# Patient Record
Sex: Male | Born: 1975 | Race: White | Hispanic: Yes | Marital: Married | Smoking: Former smoker
Health system: Western US, Academic
[De-identification: ages and names within clinical notes are randomized; demographics above are authoritative.]

## PROBLEM LIST (undated history)

## (undated) DIAGNOSIS — E7800 Pure hypercholesterolemia, unspecified: Secondary | ICD-10-CM

## (undated) DIAGNOSIS — I10 Essential (primary) hypertension: Secondary | ICD-10-CM

## (undated) HISTORY — DX: Pure hypercholesterolemia, unspecified: E78.00

## (undated) HISTORY — DX: Essential (primary) hypertension: I10

---

## 2019-01-01 ENCOUNTER — Encounter: Payer: Self-pay | Admitting: Internal Medicine

## 2019-01-01 ENCOUNTER — Telehealth: Payer: BLUE CROSS/BLUE SHIELD | Admitting: Internal Medicine

## 2019-01-01 ENCOUNTER — Telehealth: Payer: Self-pay

## 2019-01-01 DIAGNOSIS — Z20822 Contact with and (suspected) exposure to covid-19: Secondary | ICD-10-CM

## 2019-01-01 DIAGNOSIS — Z20828 Contact with and (suspected) exposure to other viral communicable diseases: Secondary | ICD-10-CM

## 2019-01-01 DIAGNOSIS — R197 Diarrhea, unspecified: Secondary | ICD-10-CM

## 2019-01-01 DIAGNOSIS — R519 Headache, unspecified: Secondary | ICD-10-CM

## 2019-01-01 MED ORDER — ATORVASTATIN CALCIUM 20 MG OR TABS: 20.0000 mg | ORAL_TABLET | Freq: Every day | ORAL | Status: AC

## 2019-01-01 MED ORDER — LISINOPRIL 10 MG OR TABS: 10.0000 mg | ORAL_TABLET | Freq: Every day | ORAL | Status: AC

## 2019-01-01 NOTE — Patient Instructions (Addendum)
To help limit the spread and risks of exposure to COVID-19, social distancing and general stay-at-home guidelines are recommended, along with practicing the following standard precautions:      - wash your hands regularly with soap and water for at least 20 seconds or use a hand sanitizer with at least 60% alcohol    - avoid touching your face (eyes, nose, mouth) with unwashed hands    - clean and disinfect frequently touched objects and surfaces with a regular household cleaning spray or wipe    - practice general social distancing by keeping distance of 6 feet away from others when possible    - avoid close contact with people who are sick    - sneeze/cough into your elbow or into a tissue that gets discarded     - stay home and away from others when you are sick    - avoid crowded places or mass gatherings    I know it has been tough as we are in the midst of truly unprecedented times, but we remain hopeful that all of our collective efforts will help get [place] through this.  Please do your best to keep regular sleep hours, get some fresh air and exercise regularly, keep normal meal times, and manage your stress as best as possible.  Things do continue to evolve and change, however, so please check regularly for updates.      Diarrhea, Adult  Diarrhea is frequent loose and watery bowel movements. Diarrhea can make you feel weak and cause you to become dehydrated. Dehydration can make you tired and thirsty, cause you to have a dry mouth, and decrease how often you urinate.  Diarrhea typically lasts 2-3 days. However, it can last longer if it is a sign of something more serious. It is important to treat your diarrhea as told by your health care provider.  Follow these instructions at home:  Eating and drinking         Follow these recommendations as told by your health care provider:   Take an oral rehydration solution (ORS). This is an over-the-counter medicine that helps return your body to its normal balance of  nutrients and water. It is found at pharmacies and retail stores.   Drink plenty of fluids, such as water, ice chips, diluted fruit juice, and low-calorie sports drinks. You can drink milk also, if desired.   Avoid drinking fluids that contain a lot of sugar or caffeine, such as energy drinks, sports drinks, and soda.   Eat bland, easy-to-digest foods in small amounts as you are able. These foods include bananas, applesauce, rice, lean meats, toast, and crackers.   Avoid alcohol.   Avoid spicy or fatty foods.    Medicines   Take over-the-counter and prescription medicines only as told by your health care provider.   If you were prescribed an antibiotic medicine, take it as told by your health care provider. Do not stop using the antibiotic even if you start to feel better.  General instructions     Wash your hands often using soap and water. If soap and water are not available, use a hand sanitizer. Others in the household should wash their hands as well. Hands should be washed:  ? After using the toilet or changing a diaper.  ? Before preparing, cooking, or serving food.  ? While caring for a sick person or while visiting someone in a hospital.   Drink enough fluid to keep your urine pale yellow.  Rest at home while you recover.   Watch your condition for any changes.   Take a warm bath to relieve any burning or pain from frequent diarrhea episodes.   Keep all follow-up visits as told by your health care provider. This is important.  Contact a health care provider if:   You have a fever.   Your diarrhea gets worse.   You have new symptoms.   You cannot keep fluids down.   You feel light-headed or dizzy.   You have a headache.   You have muscle cramps.  Get help right away if:   You have chest pain.   You feel extremely weak or you faint.   You have bloody or black stools or stools that look like tar.   You have severe pain, cramping, or bloating in your abdomen.   You have trouble  breathing or you are breathing very quickly.   Your heart is beating very quickly.   Your skin feels cold and clammy.   You feel confused.   You have signs of dehydration, such as:  ? Dark urine, very little urine, or no urine.  ? Cracked lips.  ? Dry mouth.  ? Sunken eyes.  ? Sleepiness.  ? Weakness.  Summary   Diarrhea is frequent loose and watery bowel movements. Diarrhea can make you feel weak and cause you to become dehydrated.   Drink enough fluids to keep your urine pale yellow.   Make sure that you wash your hands after using the toilet. If soap and water are not available, use hand sanitizer.   Contact a health care provider if your diarrhea gets worse or you have new symptoms.   Get help right away if you have signs of dehydration.  This information is not intended to replace advice given to you by your health care provider. Make sure you discuss any questions you have with your health care provider.  Document Released: 01/08/2002 Document Revised: 06/24/2017 Document Reviewed: 06/24/2017  Elsevier Interactive Patient Education  2019 [employer].

## 2019-01-01 NOTE — Telephone Encounter (Signed)
Patient's spouse had called stating that after video visit someone was suppose to reach out to them to schedule COVID-19 testing.  Renee stated that it was suppose to be scheduled  Today.  Please advise

## 2019-01-01 NOTE — Progress Notes (Signed)
Patient gives verbal consent to a Telephone or Video visit and is currently in the [place] of [place] during this visit.      Due to COVID-19 pandemic and a federally declared state of public health emergency, this service is being conducted via telephone.  Patient consented to proceeding with telemedicine visit today. Total time spent was 11-20 minutes. The telemedicine visit was conducted with Audio+Video     (Department) TELEMEDICINE APPOINTMENT    Date of service: 01/01/19     Reason for visit: Headache, Fatigue, Diarrhea, and Cough (tickling in throat)       Patient location:  Are you currently in the state of [place]: yes    HPI:    Was patient ever tested COVID-19 test: yes, rapid antigen on 11/28.    Patient says that over the last 6 days he has had a headache 7/10, occipital, woke him up last night, cough, eye itching, fatigue and diarrhea 7-8x/day, and is feeling colder than his usual. He says he had a temperature of 99.56F last night. He also mentions RUQ abdominal pain 4/10, dull, a/w bloating. He denies loss of taste or smell, fall or hitting his head, blurry vision, weakness, tingling, numbness, fecal or urinary incontinence, sob, sputum, wheezing, chest pain, blood in the stool, melena, nausea, vomiting, dysuria, hematuria, nasal congestion, eye discharge, sore throat, dizziness,     For his headache he has been taking     He denies anybody around him having similar symptoms.  He says he was exposed to someone that tested + for COVID-19 1 week ago. He works at a [employer].    History:    Past Medical History:   Diagnosis Date   . Hypercholesterolemia    . Hypertension      No past surgical history on file.  Family History   Problem Relation Name Age of Onset   . Diabetes Mother       Social History     Socioeconomic History   . Marital status: Married     Spouse name: Not on file   . Number of children: Not on file   . Years of education: Not on file   . Highest education level: Not on  file   Occupational History   . Not on file   Tobacco Use   . Smoking status: Former Smoker     Quit date: 01/01/1999     Years since quitting: 20.0   . Smokeless tobacco: Never Used   Substance and Sexual Activity   . Alcohol use: Yes     Frequency: 2-3 times a week     Drinks per session: 1 or 2   . Drug use: Never   . Sexual activity: Not on file   Social Activities of Daily Living Present   . Not on file   Social History Narrative   . Not on file     No Known Allergies  Current Outpatient Medications   Medication Sig Dispense Refill   . atorvastatin (LIPITOR) 20 MG tablet Take 20 mg by mouth daily.     Marland Kitchen lisinopril (PRINIVIL, ZESTRIL) 10 MG tablet Take 10 mg by mouth daily.       No current facility-administered medications for this visit.        Review of Systems:  A 12 point review of systems was negative except as noted per HPI.     Physical Examination:  Gen: Alert, no distress non-toxic appearing  Head: NC/AT  ENT:  unremarkable appearing external nose and ears  Pulm: No respiratory distress, no audible stridor, speaking in full sentences without difficulty  Skin: No jaundice, no cyanosis  Neuro: Awake, alert  Psych: conversing appropriately, normal affect and thought content is appropriate    Assessment and Plan:  Jatavis was seen today for headache, fatigue, diarrhea and cough.    Diagnoses and all orders for this visit:    Exposure to COVID-19 virus  Acute nonintractable headache, unspecified headache type  Diarrhea of presumed infectious origin  Symptoms are likely a viral syndrome, covid-19 test indicated given exposure  -     Coronavirus Disease 2019 (COVID-19) SCOV Nasal-Pharyngeal; Future  Patients symptoms are stable, no signs/symptoms that would require admission.  Patient counseled to continue to monitor for symptoms improvement/worsening and to go to the ED if worsening sob, nausea/vomiting, if unable to tolerate food or severe symptoms.  Tylenol prn for fevers and to maintain  hydration.  Advised patient to continue self-isolation for total 10 days since onset of symptoms, no fevers for 24 hours, and feeling well enough to work.   Advised to continue home isolation, physical distancing, wearing PPE, frequent handwashing.  To follow up promptly if any new or worsening symptoms.      Follow up PRN if symptoms worsen or fail to improve       Patient Questionnaire Responses on 01/01/2019   Are you in state of [place]?: Yes  Do you consent to proceed with the Video Visit today?: Yes

## 2019-01-01 NOTE — Interdisciplinary (Signed)
Patient being seen for headache, fatigue, diarrhea x 6 days    He works at [employer]- multiple coworkers have tested postive

## 2019-01-02 ENCOUNTER — Ambulatory Visit: Payer: BLUE CROSS/BLUE SHIELD | Attending: Internal Medicine

## 2019-01-02 DIAGNOSIS — R197 Diarrhea, unspecified: Secondary | ICD-10-CM | POA: Insufficient documentation

## 2019-01-02 DIAGNOSIS — R519 Headache, unspecified: Secondary | ICD-10-CM | POA: Insufficient documentation

## 2019-01-02 DIAGNOSIS — Z20828 Contact with and (suspected) exposure to other viral communicable diseases: Secondary | ICD-10-CM | POA: Insufficient documentation

## 2019-01-02 NOTE — Telephone Encounter (Signed)
Patient is schedule for COVID-19 Testing on 01/02/19 @ [facility] location

## 2019-01-02 NOTE — Interdisciplinary (Signed)
Pt symptomatic.  Verified name and DOB, explained procedure.  Performed nasal-pharyngeal swab for covid-19, pt tolerated well.  No s/s of distress noted.   TBrownRN

## 2019-01-03 ENCOUNTER — Encounter: Payer: Self-pay | Admitting: Internal Medicine

## 2019-01-03 LAB — CORONAVIRUS DISEASE 2019 (COVID-19) SCOV
COVID-19 Comment: NOT DETECTED
COVID-19 Result: NOT DETECTED

## 2019-01-03 NOTE — Telephone Encounter (Signed)
I informed patient of negative covid-19 over the phone, I also sent him a message in my chart.

## 2019-01-05 ENCOUNTER — Ambulatory Visit: Payer: BLUE CROSS/BLUE SHIELD | Admitting: Internal Medicine

## 2019-01-31 ENCOUNTER — Ambulatory Visit: Payer: BLUE CROSS/BLUE SHIELD | Admitting: Family Practice

## 2019-01-31 ENCOUNTER — Encounter: Payer: Self-pay | Admitting: Family Practice

## 2019-01-31 VITALS — BP 132/88 | HR 70 | Temp 98.9°F | Ht 66.0 in | Wt 235.0 lb

## 2019-01-31 DIAGNOSIS — Z1329 Encounter for screening for other suspected endocrine disorder: Secondary | ICD-10-CM

## 2019-01-31 DIAGNOSIS — E669 Obesity, unspecified: Secondary | ICD-10-CM

## 2019-01-31 DIAGNOSIS — Z6837 Body mass index (BMI) 37.0-37.9, adult: Secondary | ICD-10-CM

## 2019-01-31 DIAGNOSIS — Z125 Encounter for screening for malignant neoplasm of prostate: Secondary | ICD-10-CM

## 2019-01-31 DIAGNOSIS — R7303 Prediabetes: Secondary | ICD-10-CM

## 2019-01-31 DIAGNOSIS — E782 Mixed hyperlipidemia: Secondary | ICD-10-CM

## 2019-01-31 DIAGNOSIS — Z1211 Encounter for screening for malignant neoplasm of colon: Secondary | ICD-10-CM

## 2019-01-31 DIAGNOSIS — I10 Essential (primary) hypertension: Secondary | ICD-10-CM

## 2019-01-31 NOTE — Interdisciplinary (Signed)
Patient here today to establish care.     Rectal discomfort X 2 months

## 2019-01-31 NOTE — Progress Notes (Signed)
[facility] TEMPLATE  Chief Complaint: Gary Mejia is a 43 year old male who presents with Rectal Pain         HPI HPI   Here to establish care   He does have controlled BP and controlled high cholesterol.l He also is over weight  He is borderline diabetic     Medical History: Past Medical History:   Diagnosis Date   . Hypercholesterolemia    . Hypertension           Surgical History: No past surgical history on file.       Family History: Family History   Problem Relation Name Age of Onset   . Diabetes Mother            Social History: Social History     Socioeconomic History   . Marital status: Married     Spouse name: Not on file   . Number of children: Not on file   . Years of education: Not on file   . Highest education level: Not on file   Occupational History   . Not on file   Tobacco Use   . Smoking status: Former Smoker     Quit date: 01/01/1999     Years since quitting: 20.0   . Smokeless tobacco: Never Used   Substance and Sexual Activity   . Alcohol use: Yes     Frequency: 2-3 times a week     Drinks per session: 1 or 2   . Drug use: Never   . Sexual activity: Not on file   Social Activities of Daily Living Present   . Not on file   Social History Narrative   . Not on file          Allergies: No Known Allergies       Current Medications: Current Outpatient Medications   Medication Sig Dispense Refill   . atorvastatin (LIPITOR) 20 MG tablet Take 20 mg by mouth daily.     Marland Kitchen lisinopril (PRINIVIL, ZESTRIL) 10 MG tablet Take 10 mg by mouth daily.       No current facility-administered medications for this visit.           Review of System: Review of Systems   Constitutional: Negative.    HENT: Negative.    Eyes: Negative.    Respiratory: Negative.    Cardiovascular: Negative.    Gastrointestinal: Negative.    Endocrine: Negative.    Genitourinary: Negative.    Musculoskeletal: Negative.    Skin: Negative.    Allergic/Immunologic: Negative.    Neurological: Negative.    Hematological: Negative.       Psychiatric/Behavioral: Negative.    All other systems reviewed and are negative.         Vital Signs: Blood pressure (BP): 132/88  Heart Rate: 70  Temperature: 98.9 F (37.2 C)     Height: 5\' 6"  (167.6 cm)  Weight: 106.6 kg (235 lb)    Physical Examination: Physical Exam   Constitutional: He is oriented to person, place, and time. He appears well-developed and well-nourished.   HENT:   Head: Normocephalic and atraumatic.   Right Ear: External ear normal.   Left Ear: External ear normal.   Nose: Nose normal.   Mouth/Throat: Oropharynx is clear and moist.   Eyes: Pupils are equal, round, and reactive to light. Conjunctivae and EOM are normal.   Neck: Normal range of motion. Neck supple.   Cardiovascular: Normal rate, regular rhythm, normal heart sounds  and intact distal pulses.   Pulmonary/Chest: Effort normal and breath sounds normal.   Abdominal: Soft. Bowel sounds are normal.   Musculoskeletal: Normal range of motion.   Neurological: He is alert and oriented to person, place, and time. He has normal reflexes.   Skin: Skin is warm and dry.   Psychiatric: He has a normal mood and affect. His behavior is normal. Judgment and thought content normal.   Nursing note and vitals reviewed.     Assessment & Plan   Gary Mejia was seen today for rectal pain.    Diagnoses and all orders for this visit:    Mixed hyperlipidemia  -     Comprehensive Metabolic Panel Green; Future  -     Lipid Panel Green Plasma Separator Tube; Future  Doing well on statin  Stay on Lipitor    Essential (primary) hypertension  -     Comprehensive Metabolic Panel Green; Future  Doing well on meds  Low sodium diet    Class 2 obesity with body mass index (BMI) of 37.0 to 37.9 in adult, unspecified obesity type, unspecified whether serious comorbidity present    Special screening for malignant neoplasm of prostate  -     PSA (Screen), Blood - See Instructions; Future    Special screening for malignant neoplasms, colon  -     Stool Immunochemical  Occult Blood; Future    Borderline diabetes  -     Urinalysis; Future  -     Glycosylated Hgb(A1C), Blood Lavender; Future    Screening for endocrine disorder  -     CBC w/ Diff Lavender; Future  -     TSH, Blood - See Instructions; Future  Schedule CPE  Do blood work

## 2019-01-31 NOTE — Patient Instructions (Signed)
Gary Mejia was seen today for rectal pain.    Diagnoses and all orders for this visit:    Mixed hyperlipidemia  -     Comprehensive Metabolic Panel Green; Future  -     Lipid Panel Green Plasma Separator Tube; Future  Doing well on statin  Stay on Lipitor    Essential (primary) hypertension  -     Comprehensive Metabolic Panel Green; Future  Doing well on meds  Low sodium diet    Class 2 obesity with body mass index (BMI) of 37.0 to 37.9 in adult, unspecified obesity type, unspecified whether serious comorbidity present    Special screening for malignant neoplasm of prostate  -     PSA (Screen), Blood - See Instructions; Future    Special screening for malignant neoplasms, colon  -     Stool Immunochemical Occult Blood; Future    Borderline diabetes  -     Urinalysis; Future  -     Glycosylated Hgb(A1C), Blood Lavender; Future    Screening for endocrine disorder  -     CBC w/ Diff Lavender; Future  -     TSH, Blood - See Instructions; Future  Schedule CPE  Do blood work

## 2019-02-05 ENCOUNTER — Other Ambulatory Visit
Admission: RE | Admit: 2019-02-05 | Discharge: 2019-02-05 | Disposition: A | Discharge status: Home / Self Care | Payer: BLUE CROSS/BLUE SHIELD | Attending: Family Practice | Admitting: Family Practice

## 2019-02-05 DIAGNOSIS — E782 Mixed hyperlipidemia: Secondary | ICD-10-CM | POA: Insufficient documentation

## 2019-02-05 DIAGNOSIS — I10 Essential (primary) hypertension: Secondary | ICD-10-CM | POA: Insufficient documentation

## 2019-02-05 DIAGNOSIS — R7303 Prediabetes: Secondary | ICD-10-CM | POA: Insufficient documentation

## 2019-02-05 DIAGNOSIS — Z1329 Encounter for screening for other suspected endocrine disorder: Secondary | ICD-10-CM

## 2019-02-05 DIAGNOSIS — Z125 Encounter for screening for malignant neoplasm of prostate: Secondary | ICD-10-CM

## 2019-02-05 LAB — CBC WITH DIFF, BLOOD
ANC automated: 4.7 10*3/uL (ref 2.0–8.1)
Basophils %: 1 %
Basophils Absolute: 0.1 10*3/uL (ref 0.0–0.2)
Eosinophils %: 1.7 %
Eosinophils Absolute: 0.1 10*3/uL (ref 0.0–0.5)
Hematocrit: 41.5 % (ref 39.5–50.0)
Hgb: 14.2 G/DL (ref 13.5–16.9)
Lymphocytes %: 29.1 %
Lymphocytes Absolute: 2.3 10*3/uL (ref 0.9–3.3)
MCH: 32.2 PG (ref 27.0–33.5)
MCHC: 34.3 G/DL (ref 32.0–35.5)
MCV: 93.9 FL (ref 81.5–97.0)
MPV: 8.9 FL (ref 7.2–11.7)
Monocytes %: 9.1 %
Monocytes Absolute: 0.7 10*3/uL (ref 0.0–0.8)
Neutrophils % (A): 59.1 %
PLT Count: 230 10*3/uL (ref 150–400)
RBC: 4.42 10*6/uL (ref 4.38–5.62)
RDW-CV: 13.8 % (ref 11.6–14.4)
White Bld Cell Count: 8 10*3/uL (ref 4.0–10.5)

## 2019-02-05 LAB — URINALYSIS
Bilirubin, UA: NEGATIVE
Glucose, UA: NEGATIVE MG/DL
Hemoglobin, UA: NEGATIVE
Ketones, UA: NEGATIVE MG/DL
Leukocyte Esterase, UA: NEGATIVE
Nitrite, UA: NEGATIVE
Protein, UA: NEGATIVE MG/DL
Specific Grav, UA: 1.004 (ref 1.003–1.030)
Urobilinogen, UA: <2 MG/DL (ref <2.0)
pH, UA: 7 (ref 5.0–8.0)

## 2019-02-05 LAB — TSH, BLOOD: TSH, Ultrasensitive: 1.433 u[IU]/mL (ref 0.450–4.120)

## 2019-02-05 LAB — COMPREHENSIVE METABOLIC PANEL, BLOOD
ALT: 28 U/L (ref 7–52)
AST: 17 U/L (ref 13–39)
Albumin: 4.5 G/DL (ref 4.2–5.5)
Alk Phos: 52 U/L (ref 34–104)
BUN: 17 mg/dL (ref 7–25)
Bilirubin, Total: 0.6 mg/dL (ref 0.0–1.4)
CO2: 28 mmol/L (ref 21–31)
Calcium: 9.4 mg/dL (ref 8.6–10.3)
Chloride: 105 mmol/L (ref 98–107)
Creat: 0.8 mg/dL (ref 0.7–1.3)
Electrolyte Balance: 7 mmol/L (ref 2–12)
Glucose: 106 mg/dL (ref 70–115)
Potassium: 4.6 mmol/L (ref 3.5–5.1)
Protein, Total: 7.1 G/DL (ref 6.0–8.3)
Sodium: 140 mmol/L (ref 136–145)
eGFR - high estimate: >60 (ref >59)
eGFR - low estimate: >60 (ref >59)

## 2019-02-05 LAB — LIPID(CHOL FRACT) PANEL, BLOOD
Cholesterol: 148 MG/DL (ref <200)
HDL Cholesterol: 52 MG/DL (ref >40)
LDL Cholesterol (calc): 80 MG/DL (ref <160)
Non HDL Cholesterol (calculated): 96 MG/DL (ref <130)
Triglycerides: 78 MG/DL (ref <150)
VLDL Cholesterol (calculated): 16 MG/DL

## 2019-02-05 LAB — PSA (SCREEN), BLOOD: PSA Scrn: 0.3 ng/mL

## 2019-02-05 LAB — GLYCOSYLATED HGB(A1C), BLOOD: Glycated Hgb, A1C: 5.4 % (ref 4.6–5.6)

## 2019-02-09 ENCOUNTER — Other Ambulatory Visit
Admission: RE | Admit: 2019-02-09 | Discharge: 2019-02-09 | Disposition: A | Discharge status: Home / Self Care | Payer: BLUE CROSS/BLUE SHIELD | Attending: Family Practice | Admitting: Family Practice

## 2019-02-09 DIAGNOSIS — Z1211 Encounter for screening for malignant neoplasm of colon: Secondary | ICD-10-CM | POA: Insufficient documentation

## 2019-02-12 LAB — STOOL IMMUNOCHEMICAL OCCULT BLOOD: Occult Bld, (FIT): NEGATIVE

## 2019-02-22 ENCOUNTER — Encounter: Payer: Self-pay | Admitting: Family Practice

## 2019-02-22 ENCOUNTER — Ambulatory Visit (INDEPENDENT_AMBULATORY_CARE_PROVIDER_SITE_OTHER): Payer: BLUE CROSS/BLUE SHIELD | Admitting: Family Practice

## 2019-02-22 VITALS — BP 135/77 | HR 79 | Temp 97.7°F | Ht 66.0 in | Wt 233.0 lb

## 2019-02-22 DIAGNOSIS — Z0000 Encounter for general adult medical examination without abnormal findings: Secondary | ICD-10-CM

## 2019-02-22 NOTE — Progress Notes (Signed)
[facility] TEMPLATE  Chief Complaint: Gary Mejia is a 44 year old male who presents with Physical         HPI HPI   Patient is here for yearly physical. No new changes since last physical.  Denies weight loss  Denies night sweats  Denies SOB, CP, HA, Visual changes  Denies recent infection  Has normal appetite  Has normal sleep patterns   Denies any recent injury  Denies any new moles or changing moles on the body   Medical History: No past medical history on file.     Medical History: Past Medical History:   Diagnosis Date   . Hypercholesterolemia    . Hypertension           Surgical History: No past surgical history on file.       Family History: Family History   Problem Relation Name Age of Onset   . Diabetes Mother            Social History: Social History     Socioeconomic History   . Marital status: Married     Spouse name: Not on file   . Number of children: Not on file   . Years of education: Not on file   . Highest education level: Not on file   Occupational History   . Not on file   Tobacco Use   . Smoking status: Former Smoker     Quit date: 01/01/1999     Years since quitting: 20.1   . Smokeless tobacco: Never Used   Substance and Sexual Activity   . Alcohol use: Yes     Frequency: 2-3 times a week     Drinks per session: 1 or 2   . Drug use: Never   . Sexual activity: Not on file   Social Activities of Daily Living Present   . Not on file   Social History Narrative   . Not on file          Allergies: No Known Allergies       Current Medications: Current Outpatient Medications   Medication Sig Dispense Refill   . atorvastatin (LIPITOR) 20 MG tablet Take 20 mg by mouth daily.     Marland Kitchen lisinopril (PRINIVIL, ZESTRIL) 10 MG tablet Take 10 mg by mouth daily.       No current facility-administered medications for this visit.           Review of System: Review of Systems   Constitutional: Negative.    HENT: Negative.    Eyes: Negative.    Respiratory: Negative.    Cardiovascular: Negative.       Gastrointestinal: Negative.    Endocrine: Negative.    Genitourinary: Negative.    Musculoskeletal: Negative.    Skin: Negative.    Allergic/Immunologic: Negative.    Neurological: Negative.    Hematological: Negative.    Psychiatric/Behavioral: Negative.    All other systems reviewed and are negative.         Vital Signs: Blood pressure (BP): 135/77  Heart Rate: 79  Temperature: 97.7 F (36.5 C)     Height: 5\' 6"  (167.6 cm)  Weight: 105.7 kg (233 lb)    Physical Examination: Physical Exam   Constitutional: He is oriented to person, place, and time. He appears well-developed and well-nourished.   HENT:   Head: Normocephalic and atraumatic.   Right Ear: External ear normal.   Left Ear: External ear normal.   Nose: Nose normal.  Mouth/Throat: Oropharynx is clear and moist.   Eyes: Pupils are equal, round, and reactive to light. Conjunctivae and EOM are normal.   Neck: Normal range of motion. Neck supple.   Cardiovascular: Normal rate, regular rhythm, normal heart sounds and intact distal pulses.   Pulmonary/Chest: Effort normal and breath sounds normal.   Abdominal: Soft. Bowel sounds are normal.   Genitourinary:    Prostate, penis and rectum normal.     Musculoskeletal: Normal range of motion.   Neurological: He is alert and oriented to person, place, and time. He has normal reflexes.   Skin: Skin is warm and dry.   Psychiatric: He has a normal mood and affect. His behavior is normal. Judgment and thought content normal.   Nursing note and vitals reviewed.     Hutchinson was seen today for physical.    Diagnoses and all orders for this visit:    Routine general medical examination at a health care facility        PLAN  Reviewed labs  Diet discussed  Exercise discussed  RTO one year  Patient verbalized understanding of treatment plan. All questions were answered fully.  Patient is in agreement with medical treatment plan.

## 2019-02-22 NOTE — Interdisciplinary (Signed)
Patient is here for a physical, denies chaperone

## 2019-02-22 NOTE — Patient Instructions (Signed)
rancisco was seen today for physical.    Diagnoses and all orders for this visit:    Routine general medical examination at a health care facility        PLAN  Reviewed labs  Diet discussed  Exercise discussed  RTO one year  Patient verbalized understanding of treatment plan. All questions were answered fully.  Patient is in agreement with medical treatment plan.

## 2021-10-29 ENCOUNTER — Encounter: Payer: Self-pay | Admitting: Family Practice

## 2021-11-02 ENCOUNTER — Encounter: Payer: Self-pay | Admitting: Family Practice

## 2021-11-25 ENCOUNTER — Encounter: Payer: Self-pay | Admitting: Family Practice

## 2021-11-25 DIAGNOSIS — Z5181 Encounter for therapeutic drug level monitoring: Secondary | ICD-10-CM

## 2021-11-25 DIAGNOSIS — Z1211 Encounter for screening for malignant neoplasm of colon: Secondary | ICD-10-CM

## 2022-01-11 ENCOUNTER — Encounter: Payer: Self-pay | Admitting: Family Practice

## 2022-03-23 ENCOUNTER — Encounter: Payer: Self-pay | Admitting: Family Practice

## 2022-10-18 ENCOUNTER — Encounter: Payer: Self-pay | Admitting: Family Practice

## 2022-11-09 ENCOUNTER — Encounter: Payer: Self-pay | Admitting: Family Practice

## 2022-11-09 DIAGNOSIS — Z5181 Encounter for therapeutic drug level monitoring: Secondary | ICD-10-CM

## 2022-11-09 DIAGNOSIS — Z1211 Encounter for screening for malignant neoplasm of colon: Secondary | ICD-10-CM

## 2023-10-17 IMAGING — MR COLUNA^LOMBAR
4 of 7 series · 15 of 48 positions shown · non-contrast
Comparison: none

[Series 1: T2 · sagittal · 3.5mm · 0.64mm/px · 6 of 12 slices shown (1 of 3)]
[im 1/12]
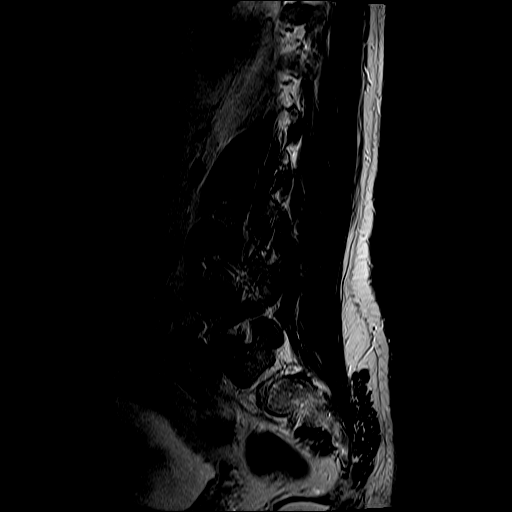
[im 3/12]
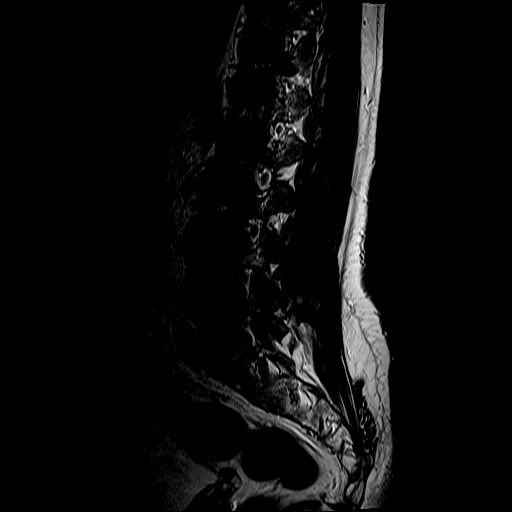
[im 5/12]
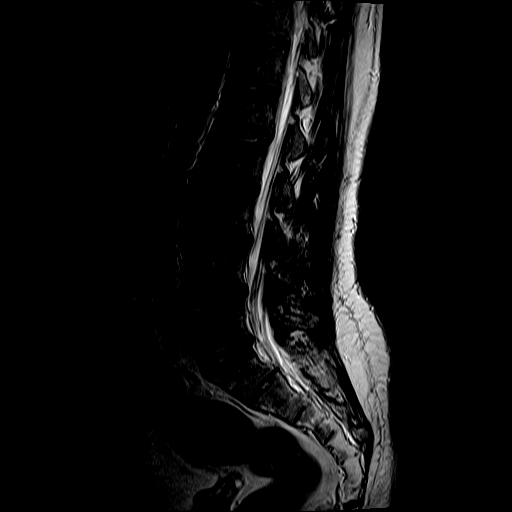
[im 7/12]
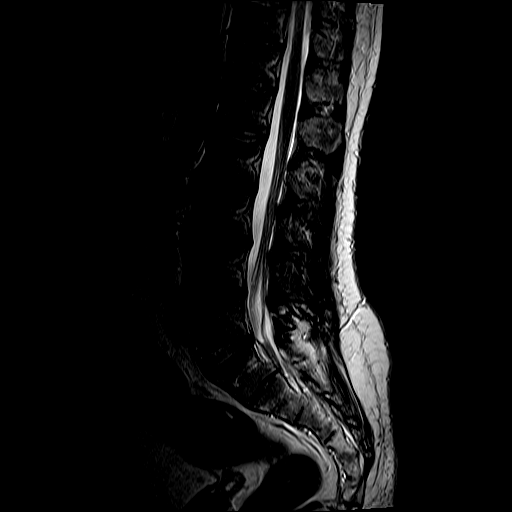
[im 9/12]
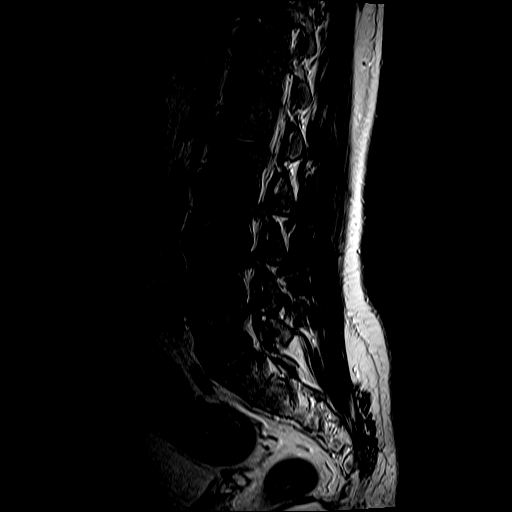
[im 12/12]
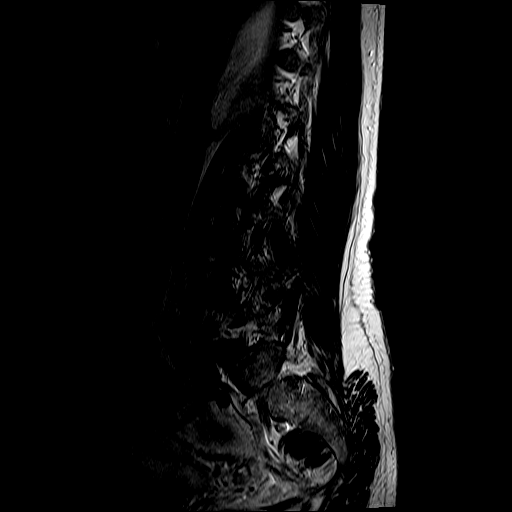

[Series 2: T2 · sagittal · 4.9mm · 0.64mm/px · 3 of 11 slices shown (2 of 3)]
[im 3/11]
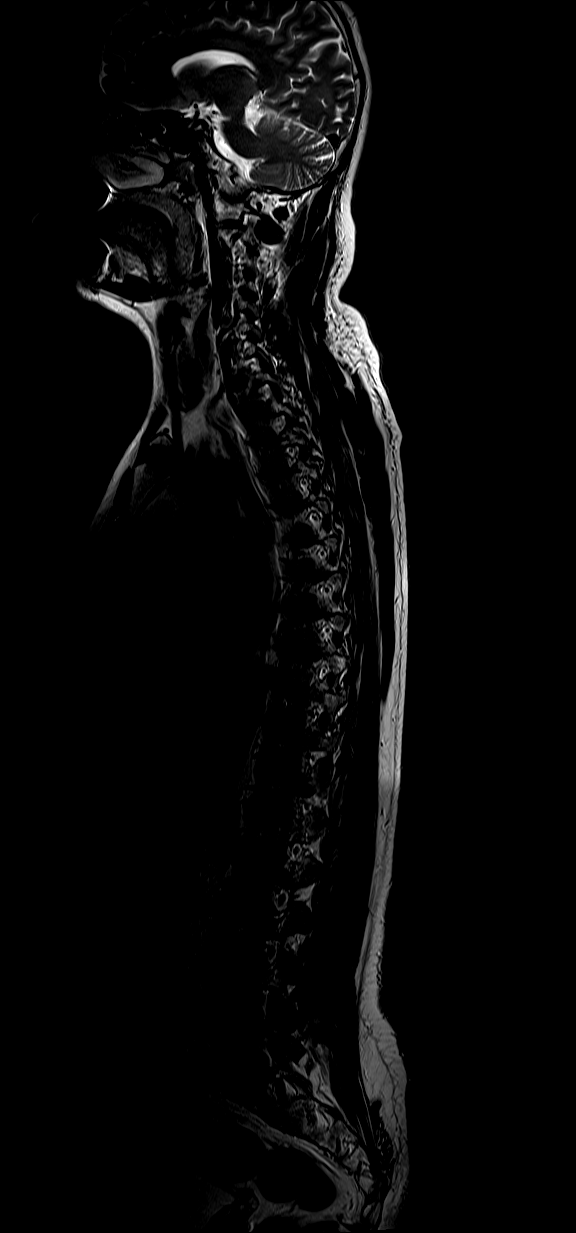
[im 7/11]
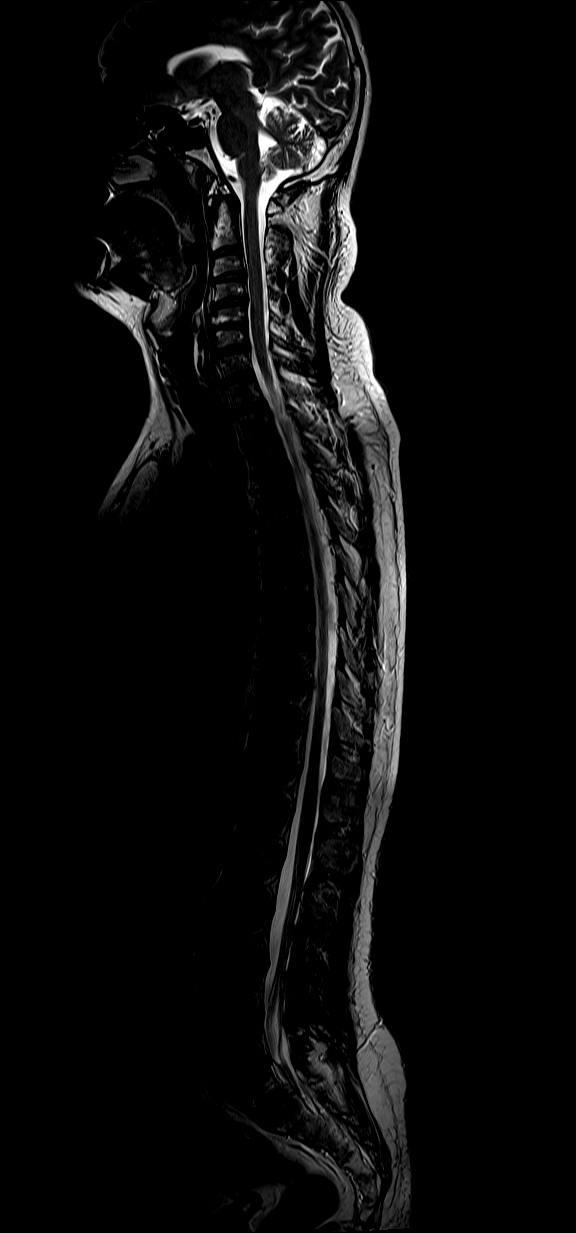
[im 11/11]
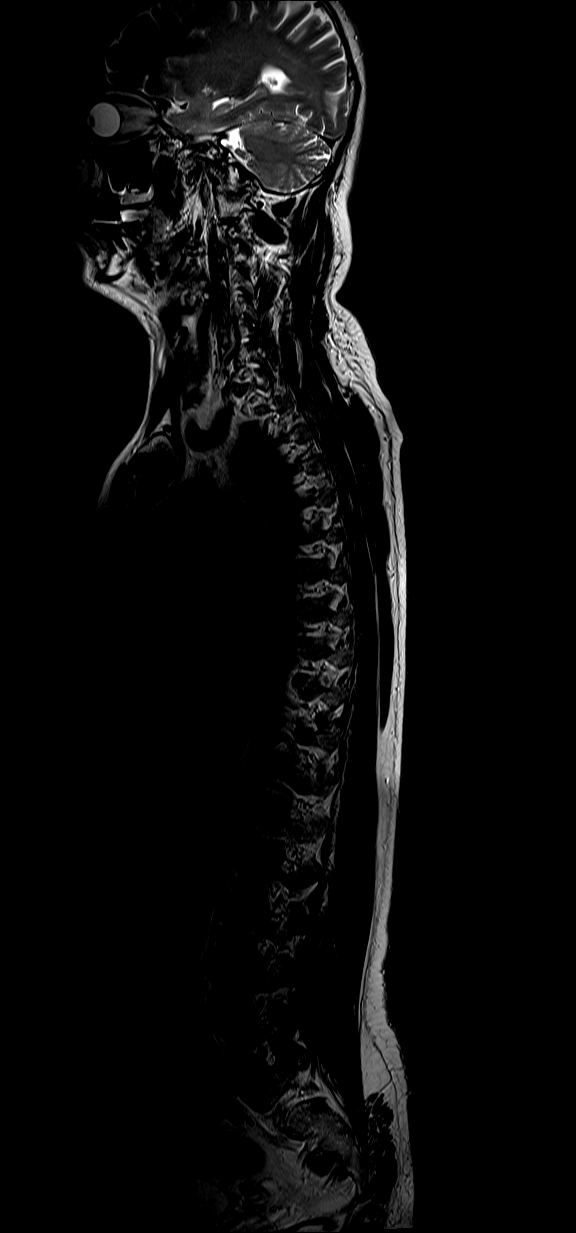

[Series 3: T1 · sagittal · 3.5mm · 0.74mm/px · 3 of 12 slices shown]
[im 3/12]
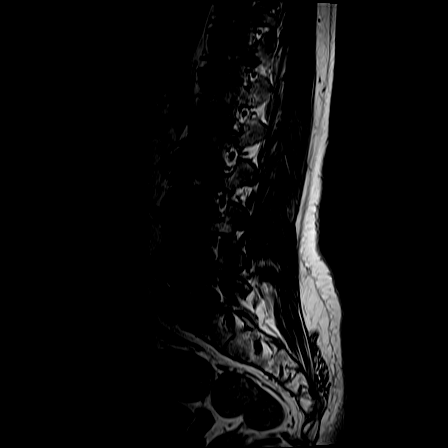
[im 7/12]
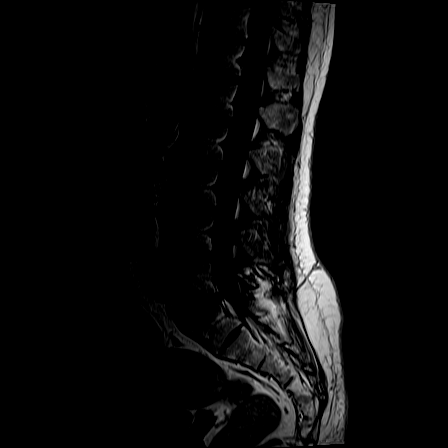
[im 12/12]
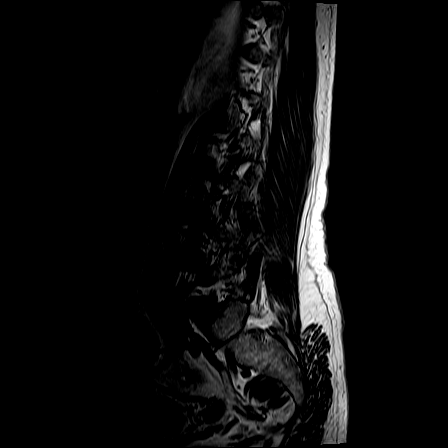

[Series 7: T2 · axial · 4.0mm · 0.62mm/px · z∈[-553,-381]mm · 3 of 25 slices shown (3 of 3)]
[im 5/25]
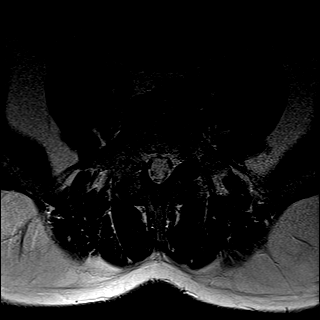
[im 14/25]
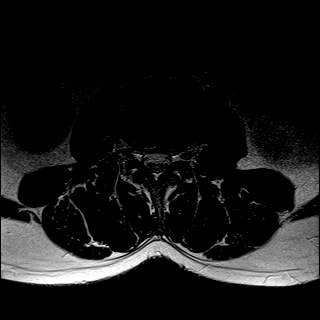
[im 22/25]
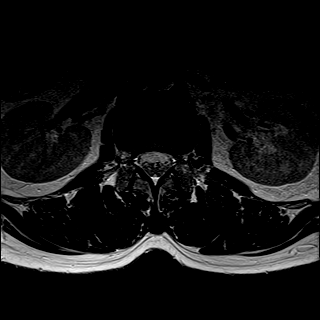

[15 of 48 positions shown; findings below may reference images not displayed]

Paciente:EVONETE LIMA GOMES
Gênero:Masculino
Indicação clínica:
Dor lombar em investigação.
Técnica:
[facility]
Exame  realizado  em  equipamento  de  ressonância  magnética  com  sequências,  ponderações  e  planos  especíﬁcos  para  o
segmento de interesse, sem a administração endovenosa do meio de contraste.
Relatório:
Alinhamento sagital posterior preservado.
RESSONÂNCIA MAGNÉTICA DE COLUNA LOMBAR
Acentuação da lordose ﬁsiológica lombar.
Corpos vertebrais com alturas preservadas, sem fraturas ósseas desalinhadas e com osteóﬁtos somatomarginais de aspecto
degenerativo.
Herniação intrasomática (Schmorl) caracterizada por projeção do disco intervertebral através da placa terminal óssea, sem
sinais de edema no corpo vertebral de D12, L1, L3 e L4.
Desidratação discal caracterizada por redução do sinal discal com redução do espaço intervertebral em L2-L3 a L5-S1.
Edema no ligamento interespinhoso inferindo provável sobrecarga mecânica em L2-L3 a L5-S1.
Leve abaulamento discal difuso que toca a face ventral do saco dural e reduz a amplitude foraminal bilateral sem sinais de
compressão radicular signiﬁcativa em L3-L4.
Abaulamento discal assimétrico que comprime a face ventral do saco dural, reduz a amplitude foraminal à esquerda em L4-
L5. Compressão da porção intracanal da raiz descendente esquerda de L5.
Abaulamento discal difuso que reduz a amplitude foraminal bilateral, comprime a face ventral do saco dural e as raízes
nervosas emergentes e descendentes bilaterais na correspondência  em L5-S1.
Alterações degenerativas nas articulações interapoﬁsárias inferiores.
[facility] medular tópico, com morfologia e características de sinal normais.
Paciente:EVONETE LIMA GOMES
Gênero:Masculino
Lipossubstituição parcial dos ventres musculares lombosacrais.
Alterações degenerativas nas articulações sacroilíacas.
Canal vertebral com amplitude habitual.
Impressão:
Discopatia lombar.
[facility]
Compressão radicular.
Herniação intrasomática vertebral (Schmorl).
Edema no ligamento interespinhoso inferindo provável sobrecarga mecânica.
Alterações pormenorizadas são descritas no corpo do laudo.

## 2023-10-17 IMAGING — [facility]^CERVICAL
5 of 8 series · 27 of 48 positions shown · non-contrast
Comparison: none

[Series 3: T2 · sagittal · 3.5mm · 0.64mm/px · 5 of 12 slices shown (1 of 3)]
[im 1/12]
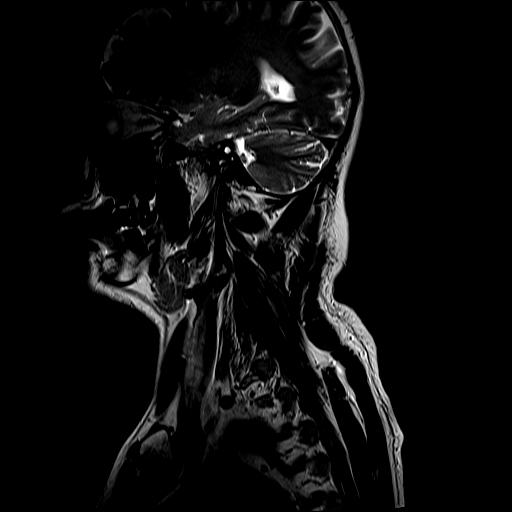
[im 3/12]
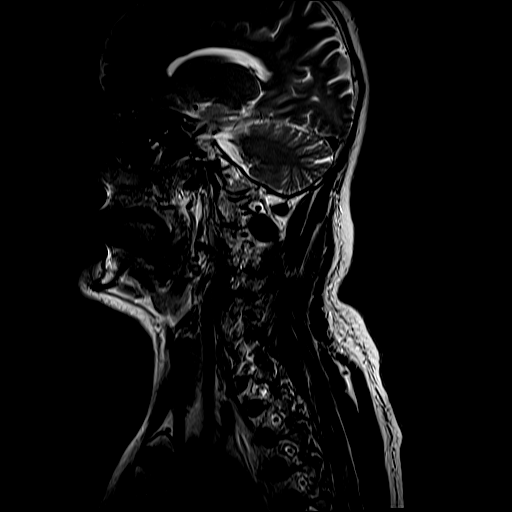
[im 6/12]
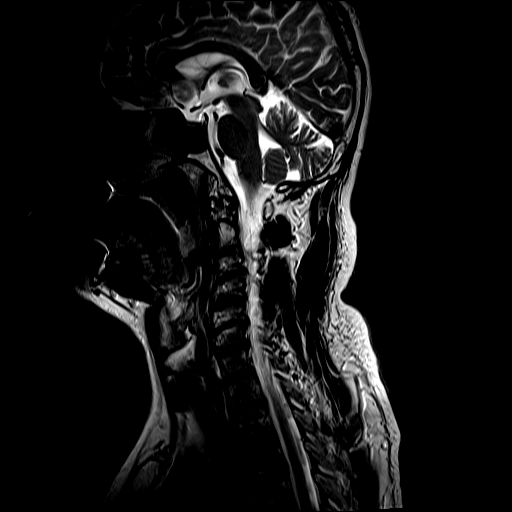
[im 9/12]
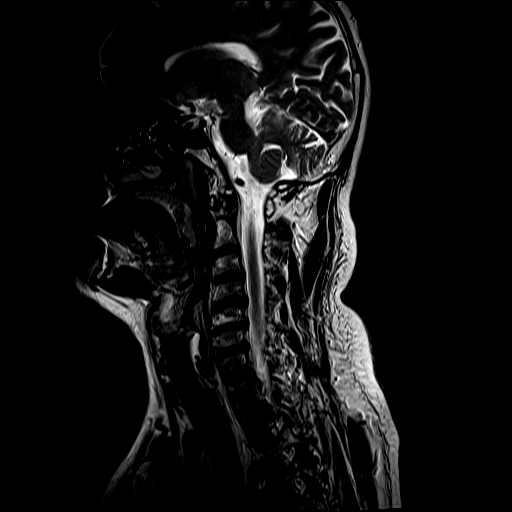
[im 12/12]
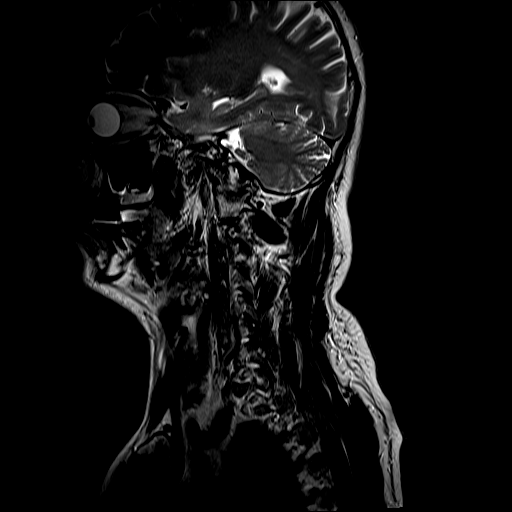

[Series 4: T1 · sagittal · 3.5mm · 0.74mm/px · 5 of 12 slices shown]
[im 1/12]
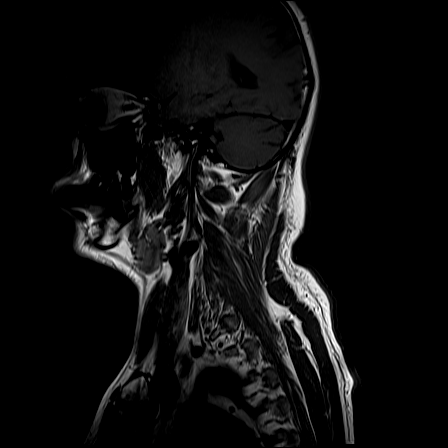
[im 3/12]
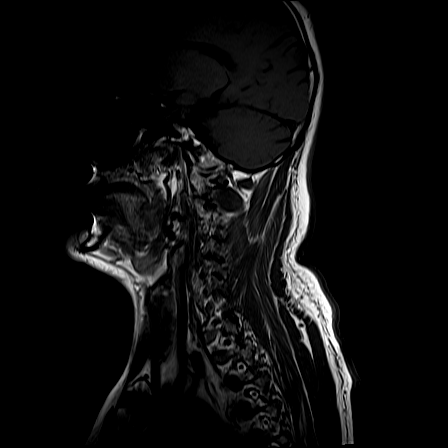
[im 6/12]
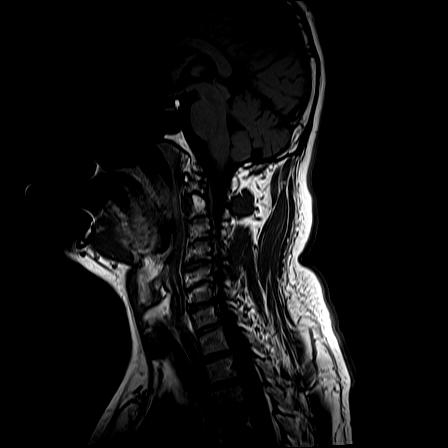
[im 9/12]
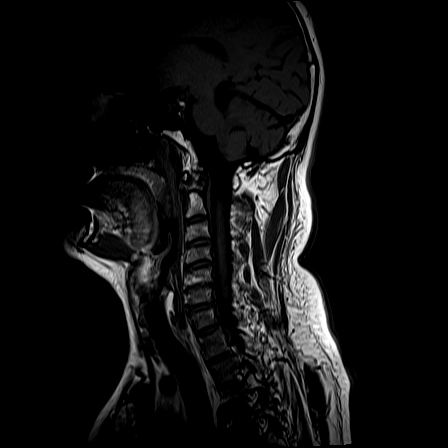
[im 12/12]
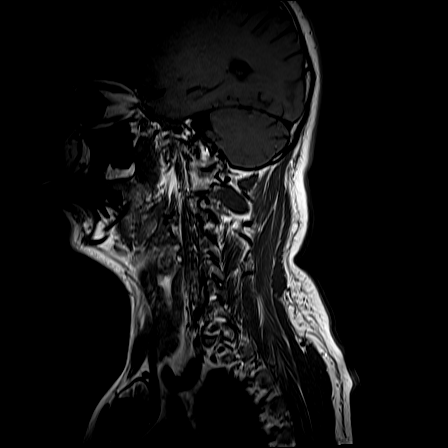

[Series 5: STIR · sagittal · 3.5mm · 0.86mm/px · 3 of 12 slices shown]
[im 1/12]
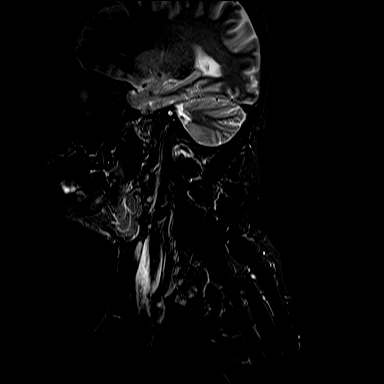
[im 3/12]
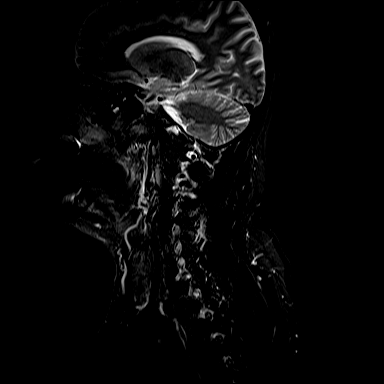
[im 6/12]
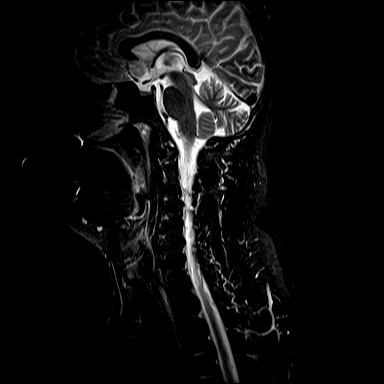

[Series 6: T2 · axial · 3.0mm · 0.62mm/px · z∈[-106,+5]mm · 9 of 30 slices shown (2 of 3)]
[im 1/30]
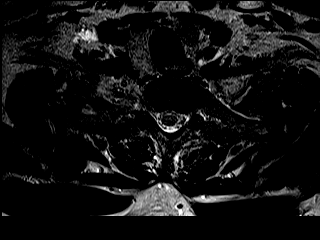
[im 6/30]
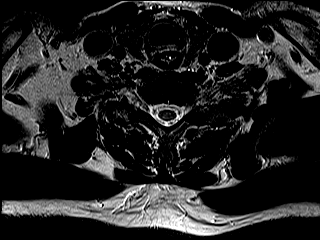
[im 8/30]
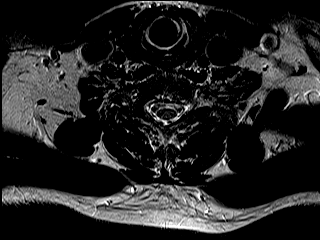
[im 14/30]
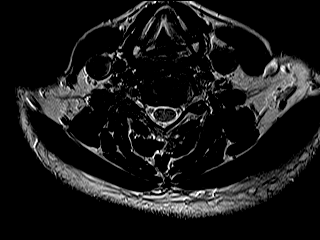
[im 16/30]
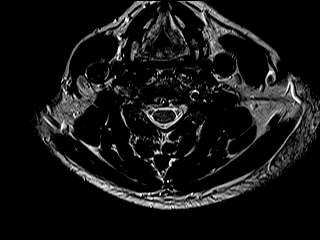
[im 22/30]
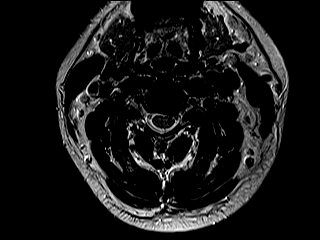
[im 24/30]
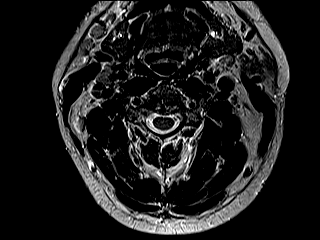
[im 27/30]
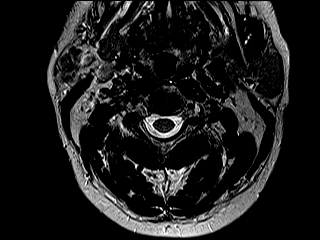
[im 30/30]
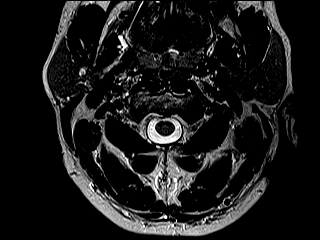

[Series 8: T2 · sagittal · 3.5mm · 0.64mm/px · 5 of 12 slices shown (3 of 3)]
[im 1/12]
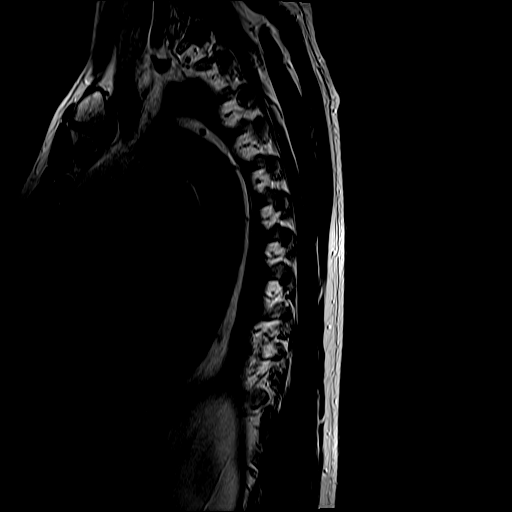
[im 3/12]
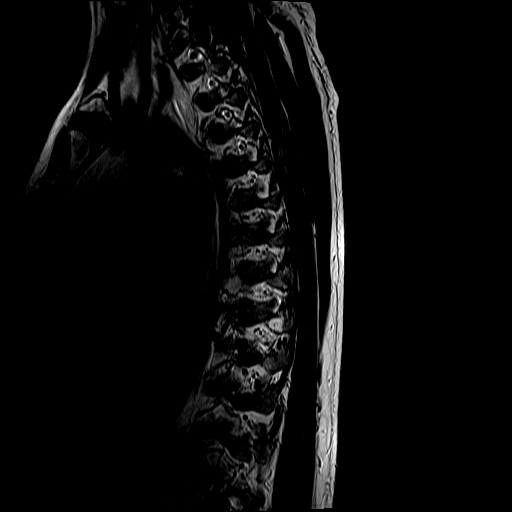
[im 6/12]
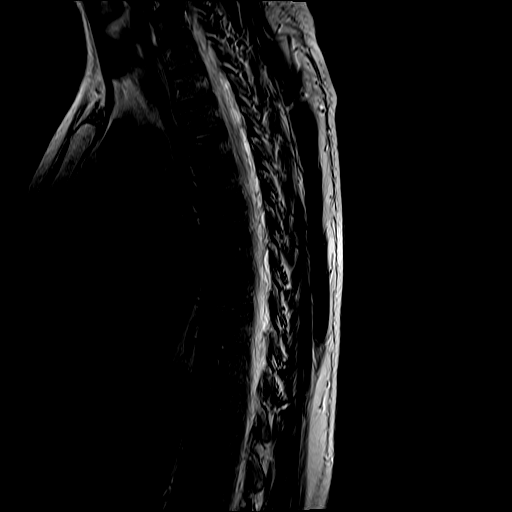
[im 9/12]
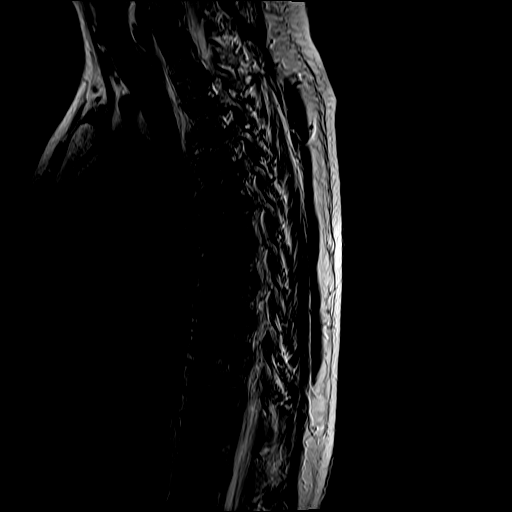
[im 12/12]
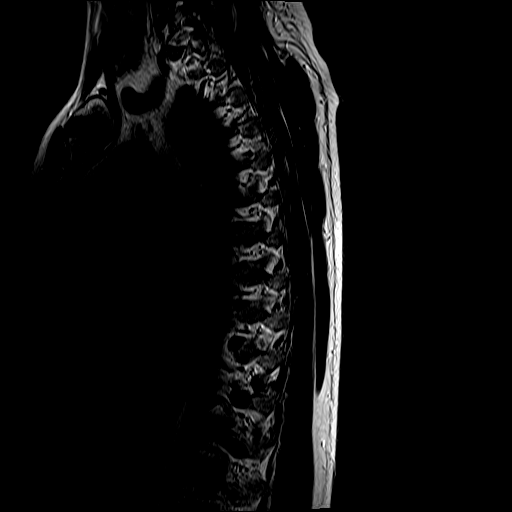

[27 of 48 positions shown; findings below may reference images not displayed]

Paciente:JACKELINE VIDOTTI
Gênero:Masculino
Indicação clínica:
Cervicalgia refratária.
Técnica:
Realizadas imagens multiplanares nas sequências  pesadas em T1, T2 e STIR.
[facility]
Exame sem infusão de contraste endovenoso paramagnético.
Relatório:
Transição crânio-cervical sem alterações.
[facility] CERVICAL
Alinhamento posterior mantido.
Retiﬁcação do alinhamento cervical.
Corpos vertebrais com alturas preservadas e osteóﬁtos marginais.
Focos de hipersinal em T1 e T2 sugestivos de alteração de sinal Modic tipo II (gordura).
Desidratação dos discos intervertebrais estudados, caracterizada por redução do sinal discal nas sequências T2 e STIR com
redução do espaço intervertebral em C2-C3 a C6-C7.
Pequena protrusão discal posteromediana em C2-C3 que toca a face ventral do saco dural.
Protrusão discal póstero mediana que comprime a face ventral do saco dural em C3-C4.
Abaulamento discal assimétrico que comprime a face ventral do saco dural, reduz a amplitude foraminal à esquerda em C4-
C5. Compressão da porção intracanal da raiz descendente.
Abaulamento discal assimétrico que comprime a face ventral do saco dural, reduz a amplitude foraminal à direita em C5-C6.
Compressão da porção intracanal da raiz correspondente.
Abaulamento discal difuso que reduz a amplitude foraminal bilateral, comprime a face ventral do saco dural e as raízes
nervosas bilaterais na correspondência em C6-C7.
Protrusão discal posteromediana e C7-D1 que toca a face ventral do saco dural.
Paciente:JACKELINE VIDOTTI
Gênero:Masculino
Demais discos intervertebrais sem evidências de herniações signiﬁcativas.
Alterações degenerativas nas articulações uncovertebrais determinando redução da amplitude dos neuroforames.
Alterações degenerativas nas articulações interapoﬁsárias inferiores.  
Medula de calibre, morfologia e características de sinal normal.
Musculatura paravertebral de aspecto habitual para a faixa etária.
Ligamentos amarelos com espessura habitual.
[facility]
Canal vertebral com boa amplitude.
Pedículos e lâminas íntegros.
Impressão:
Discopatia cervical.
Focos de hipersinal em T1 e T2 sugestivos de alteração de sinal Modic tipo II (gordura).
Alterações pormenorizadas são descritas no corpo do laudo.
